# Patient Record
Sex: Female | Born: 2000 | Race: White | Hispanic: No | Marital: Single | Smoking: Never smoker
Health system: Southern US, Community
[De-identification: ages and names within clinical notes are randomized; demographics above are authoritative.]

---
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
## 2021-01-15 ENCOUNTER — Encounter (HOSPITAL_COMMUNITY): Payer: Self-pay

## 2021-01-15 ENCOUNTER — Telehealth (HOSPITAL_COMMUNITY): Payer: Self-pay

## 2021-01-15 ENCOUNTER — Other Ambulatory Visit: Payer: Self-pay

## 2021-01-15 ENCOUNTER — Ambulatory Visit (HOSPITAL_COMMUNITY)
Admission: EM | Admit: 2021-01-15 | Discharge: 2021-01-15 | Disposition: A | Discharge status: Home / Self Care | Payer: BC Managed Care – PPO | Attending: Family Medicine | Admitting: Family Medicine

## 2021-01-15 DIAGNOSIS — T7840XA Allergy, unspecified, initial encounter: Secondary | ICD-10-CM

## 2021-01-15 DIAGNOSIS — L509 Urticaria, unspecified: Secondary | ICD-10-CM

## 2021-01-15 MED ORDER — PREDNISONE 10 MG PO TABS: ORAL_TABLET | ORAL | 0 refills | Status: DC

## 2021-01-15 MED ORDER — CETIRIZINE HCL 10 MG PO TABS: 10.0000 mg | ORAL_TABLET | Freq: Two times a day (BID) | ORAL | 2 refills | Status: DC

## 2021-01-15 MED ORDER — FAMOTIDINE 40 MG PO TABS: 40.0000 mg | ORAL_TABLET | Freq: Two times a day (BID) | ORAL | 0 refills | Status: DC

## 2021-01-15 NOTE — ED Triage Notes (Signed)
Pt presents with an allergic reaction since 6 this morning. Pt states she does not know what she had a reaction to. States she has not had a reaction before to anything and denies allergies to anything.   Pt states she had hives, states she took Benadryl two hours ago and states part of the hives have went away.

## 2021-01-15 NOTE — ED Provider Notes (Signed)
[facility]    CSN: [id number] Arrival date & time: 01/15/21  1705      History   Chief Complaint Chief Complaint  Patient presents with   Allergic Reaction    HPI Jessica Bishop is a 20 y.o. female.   Patient presenting today with 1 day history of diffuse generalized itching and hives.  States she was at [employer] with friends over the weekend, used a new body wash at the place they were staying and a new after sun lotion but otherwise no new exposures that she is aware of.  She denies throat itching or swelling, difficulty breathing, chest pain, nausea, vomiting, diarrhea.  Has not taken anything other than a Benadryl about 2 hours ago which did help significantly.  No past history of allergic reactions.   History reviewed. No pertinent past medical history.  There are no problems to display for this patient.   History reviewed. No pertinent surgical history.  OB History   No obstetric history on file.      Home Medications    Prior to Admission medications   Medication Sig Start Date End Date Taking? Authorizing Provider  cetirizine (ZYRTEC ALLERGY) 10 MG tablet Take 1 tablet (10 mg total) by mouth 2 (two) times daily. 01/15/21  Yes Particia Nearing, PA-C  famotidine (PEPCID) 40 MG tablet Take 1 tablet (40 mg total) by mouth 2 (two) times daily. 01/15/21  Yes Particia Nearing, PA-C  predniSONE (DELTASONE) 10 MG tablet Take 6 tabs day one, 5 tabs day two, 4 tabs day three, etc 01/15/21  Yes Particia Nearing, PA-C    Family History History reviewed. No pertinent family history.  Social History Social History   Tobacco Use   Smoking status: Never   Smokeless tobacco: Never  Vaping Use   Vaping Use: Every day  Substance Use Topics   Alcohol use: Yes   Drug use: Never     Allergies   Patient has no known allergies.   Review of Systems Review of Systems Per HPI  Physical Exam Triage Vital Signs ED Triage Vitals  Enc  Vitals Group     BP 01/15/21 1744 121/82     Pulse Rate 01/15/21 1744 96     Resp 01/15/21 1744 19     Temp 01/15/21 1744 99.1 F (37.3 C)     Temp Source 01/15/21 1744 Oral     SpO2 01/15/21 1744 100 %     Weight --      Height --      Head Circumference --      Peak Flow --      Pain Score 01/15/21 1742 0     Pain Loc --      Pain Edu? --      Excl. in GC? --    No data found.  Updated Vital Signs BP 121/82 (BP Location: Right Arm)   Pulse 96   Temp 99.1 F (37.3 C) (Oral)   Resp 19   LMP 10/18/2020 (Approximate)   SpO2 100%   Visual Acuity Right Eye Distance:   Left Eye Distance:   Bilateral Distance:    Right Eye Near:   Left Eye Near:    Bilateral Near:     Physical Exam Vitals and nursing note reviewed.  Constitutional:      Appearance: Normal appearance. She is not ill-appearing.  HENT:     Head: Atraumatic.     Nose: Nose normal.  Mouth/Throat:     Mouth: Mucous membranes are moist.     Pharynx: Oropharynx is clear. No posterior oropharyngeal erythema.  Eyes:     Extraocular Movements: Extraocular movements intact.     Conjunctiva/sclera: Conjunctivae normal.  Cardiovascular:     Rate and Rhythm: Normal rate and regular rhythm.     Heart sounds: Normal heart sounds.  Pulmonary:     Effort: Pulmonary effort is normal.     Breath sounds: Normal breath sounds. No wheezing or rales.  Musculoskeletal:        General: Normal range of motion.     Cervical back: Normal range of motion and neck supple.  Skin:    General: Skin is warm and dry.     Findings: Rash present.     Comments: Diffuse widespread erythematous urticarial rash all 4 extremities, trunk  Neurological:     Mental Status: She is alert and oriented to person, place, and time.  Psychiatric:        Mood and Affect: Mood normal.        Thought Content: Thought content normal.        Judgment: Judgment normal.     UC Treatments / Results  Labs (all labs ordered are listed, but  only abnormal results are displayed) Labs Reviewed - No data to display  EKG   Radiology No results found.  Procedures Procedures (including critical care time)  Medications Ordered in UC Medications - No data to display  Initial Impression / Assessment and Plan / UC Course  I have reviewed the triage vital signs and the nursing notes.  Pertinent labs & imaging results that were available during my care of the patient were reviewed by me and considered in my medical decision making (see chart for details).     Will treat hives rash/allergic reaction with prednisone taper, Pepcid, Benadryl.  Discussed avoiding any scented or allergenic products.  Return for acutely worsening symptoms.  No evidence of respiratory distress related to allergic reaction.  Final Clinical Impressions(s) / UC Diagnoses   Final diagnoses:  Allergic reaction, initial encounter  Urticaria   Discharge Instructions   None    ED Prescriptions     Medication Sig Dispense Auth. Provider   predniSONE (DELTASONE) 10 MG tablet Take 6 tabs day one, 5 tabs day two, 4 tabs day three, etc 21 tablet Particia Nearing, PA-C   cetirizine (ZYRTEC ALLERGY) 10 MG tablet Take 1 tablet (10 mg total) by mouth 2 (two) times daily. 60 tablet Particia Nearing, [place]   famotidine (PEPCID) 40 MG tablet Take 1 tablet (40 mg total) by mouth 2 (two) times daily. 60 tablet Particia Nearing, [place]      PDMP not reviewed this encounter.   Particia Nearing, [place] 01/15/21 1810

## 2021-02-07 ENCOUNTER — Other Ambulatory Visit: Payer: Self-pay | Admitting: Family Medicine

## 2021-10-23 NOTE — Progress Notes (Unsigned)
 Complete Physical  Assessment and Plan: Health Maintenance- Discussed STD testing, safe sex, alcohol and drug awareness, drinking and driving dangers, wearing a seat belt and general safety measures for young adult. . Discussed med's effects and SE's. Screening labs and tests as requested with regular follow-up as recommended. Over 40 minutes of exam, counseling, chart review and critical decision making was performed  HPI  This very nice 21 y.o.female presents for complete physical.  Patient has no major health issues.  Patient reports no complaints at this time.  She {DOES_DOES WVP:71062} workout.  Finally, patient has history of Vitamin D Deficiency and last vitamin D was No results found for: "VD25OH".  Currently on supplementation  Current Medications:  Current Outpatient Medications on File Prior to Visit  Medication Sig Dispense Refill   cetirizine (ZYRTEC ALLERGY) 10 MG tablet Take 1 tablet (10 mg total) by mouth 2 (two) times daily. 60 tablet 2   famotidine (PEPCID) 40 MG tablet Take 1 tablet (40 mg total) by mouth 2 (two) times daily. 60 tablet 0   predniSONE (DELTASONE) 10 MG tablet Take 6 tabs day one, 5 tabs day two, 4 tabs day three, etc 21 tablet 0   No current facility-administered medications on file prior to visit.   Health Maintenance:    There is no immunization history on file for this patient.  TD/TDAP: Influenza: Pneumovax:  Prevnar 13:  HPV vaccines:   LMP: No LMP recorded. Sexually Active: {YES NO:22349} STD testing offered Pap: MGM: :  Allergies: No Known Allergies Medical History:  does not have a problem list on file. Surgical History:  She  has no past surgical history on file. Family History:  Her family history is not on file. Social History:   reports that she has never smoked. She has never used smokeless tobacco. She reports current alcohol use. She reports that she does not use drugs.  Review of Systems: ROS  Physical Exam: There  is no height or weight on file to calculate BMI. There were no vitals taken for this visit. General Appearance: Well nourished, in no apparent distress.  Eyes: PERRLA, EOMs, conjunctiva no swelling or erythema, normal fundi and vessels.  Sinuses: No Frontal/maxillary tenderness  ENT/Mouth: Ext aud canals clear, normal light reflex with TMs without erythema, bulging. Good dentition. No erythema, swelling, or exudate on post pharynx. Tonsils not swollen or erythematous. Hearing normal.  Neck: Supple, thyroid normal. No bruits  Respiratory: Respiratory effort normal, BS equal bilaterally without rales, rhonchi, wheezing or stridor.  Cardio: RRR without murmurs, rubs or gallops. Brisk peripheral pulses without edema.  Chest: symmetric, with normal excursions and percussion.  Breasts: Symmetric, without lumps, nipple discharge, retractions.  Abdomen: Soft, nontender, no guarding, rebound, hernias, masses, or organomegaly.  Lymphatics: Non tender without lymphadenopathy.  Genitourinary:  Musculoskeletal: Full ROM all peripheral extremities,5/5 strength, and normal gait.  Skin: Warm, dry without rashes, lesions, ecchymosis. Neuro: Cranial nerves intact, reflexes equal bilaterally. Normal muscle tone, no cerebellar symptoms. Sensation intact.  Psych: Awake and oriented X 3, normal affect, Insight and Judgment appropriate.   EKG: defer  Jessica Bishop E  9:43 AM [facility]

## 2021-10-25 ENCOUNTER — Ambulatory Visit (INDEPENDENT_AMBULATORY_CARE_PROVIDER_SITE_OTHER): Payer: BC Managed Care – PPO | Admitting: Nurse Practitioner

## 2021-10-25 ENCOUNTER — Encounter: Payer: Self-pay | Admitting: Nurse Practitioner

## 2021-10-25 VITALS — BP 112/82 | HR 100 | Temp 97.3°F | Ht 66.0 in | Wt 165.8 lb

## 2021-10-25 DIAGNOSIS — F17290 Nicotine dependence, other tobacco product, uncomplicated: Secondary | ICD-10-CM | POA: Diagnosis not present

## 2021-10-25 DIAGNOSIS — K219 Gastro-esophageal reflux disease without esophagitis: Secondary | ICD-10-CM

## 2021-10-25 DIAGNOSIS — Z3041 Encounter for surveillance of contraceptive pills: Secondary | ICD-10-CM

## 2021-10-25 DIAGNOSIS — E663 Overweight: Secondary | ICD-10-CM | POA: Diagnosis not present

## 2021-10-25 MED ORDER — LEVONORGEST-ETH ESTRAD 91-DAY 0.15-0.03 &0.01 MG PO TABS: 1.0000 | ORAL_TABLET | Freq: Every day | ORAL | 3 refills | Status: DC

## 2021-10-25 NOTE — Patient Instructions (Signed)
Take Famotidine daily for 1 month, if symptoms do not improve notify the office Famotidine Tablets What is this medication? FAMOTIDINE (fa MOE ti deen) treats heartburn, stomach ulcers, reflux disease, or other conditions that cause too much stomach acid. It works by reducing the amount of acid in the stomach. This medicine may be used for other purposes; ask your health care provider or pharmacist if you have questions. COMMON BRAND NAME(S): Heartburn Relief, Pepcid, Pepcid AC, Pepcid AC Maximum Strength, Zantac, Zantac 360 What should I tell my care team before I take this medication? They need to know if you have any of these conditions: Kidney disease Liver disease Trouble swallowing An unusual or allergic reaction to famotidine, other medications, foods, dyes, or preservatives Pregnant or trying to get pregnant Breast-feeding How should I use this medication? Take this medication by mouth with a glass of water. Follow the directions on the prescription label. If you only take this medication once a day, take it at bedtime. Take your doses at regular intervals. Do not take your medication more often than directed. Talk to your care team about the use of this medication in children. Special care may be needed. Overdosage: If you think you have taken too much of this medicine contact a poison control center or emergency room at once. NOTE: This medicine is only for you. Do not share this medicine with others. What if I miss a dose? If you miss a dose, take it as soon as you can. If it is almost time for your next dose, take only that dose. Do not take double or extra doses. What may interact with this medication? Delavirdine Itraconazole Ketoconazole This list may not describe all possible interactions. Give your health care provider a list of all the medicines, herbs, non-prescription drugs, or dietary supplements you use. Also tell them if you smoke, drink alcohol, or use illegal drugs.  Some items may interact with your medicine. What should I watch for while using this medication? Tell your care team if your condition does not start to get better or if it gets worse. Do not take with aspirin, ibuprofen or other antiinflammatory medications. These can make your condition worse. Do not smoke cigarettes or drink alcohol. These cause irritation in your stomach and can increase the time it will take for ulcers to heal. If you get black, tarry stools or vomit up what looks like coffee grounds, call your care team at once. You may have a bleeding ulcer. This medication may cause a decrease in vitamin B12. You should make sure that you get enough vitamin B12 while you are taking this medication. Discuss the foods you eat and the vitamins you take with your care team. What side effects may I notice from receiving this medication? Side effects that you should report to your care team as soon as possible: Allergic reactions--skin rash, itching, hives, swelling of the face, lips, tongue, or throat Confusion Hallucinations Side effects that usually do not require medical attention (report to your care team if they continue or are bothersome): Constipation Diarrhea Dizziness Headache This list may not describe all possible side effects. Call your doctor for medical advice about side effects. You may report side effects to FDA at 1-800-FDA-1088. Where should I keep my medication? Keep out of the reach of children and pets. Store at room temperature between 15 and 30 degrees C (59 and 86 degrees F). Do not freeze. Throw away any unused medication after the expiration date. NOTE: This  sheet is a summary. It may not cover all possible information. If you have questions about this medicine, talk to your doctor, pharmacist, or health care provider.  2023 Elsevier/Gold Standard (2020-04-12 00:00:00)

## 2022-01-22 ENCOUNTER — Encounter (HOSPITAL_COMMUNITY): Payer: Self-pay | Admitting: *Deleted

## 2022-01-22 ENCOUNTER — Other Ambulatory Visit: Payer: Self-pay

## 2022-01-22 ENCOUNTER — Emergency Department (HOSPITAL_COMMUNITY)
Admission: EM | Admit: 2022-01-22 | Discharge: 2022-01-22 | Disposition: A | Discharge status: Home / Self Care | Payer: BC Managed Care – PPO | Attending: Emergency Medicine | Admitting: Emergency Medicine

## 2022-01-22 DIAGNOSIS — R55 Syncope and collapse: Secondary | ICD-10-CM

## 2022-01-22 LAB — COMPREHENSIVE METABOLIC PANEL WITH GFR
ALT: 21 U/L (ref 0–44)
AST: 16 U/L (ref 15–41)
Albumin: 4 g/dL (ref 3.5–5.0)
Alkaline Phosphatase: 59 U/L (ref 38–126)
Anion gap: 7 (ref 5–15)
BUN: 10 mg/dL (ref 6–20)
CO2: 23 mmol/L (ref 22–32)
Calcium: 9.3 mg/dL (ref 8.9–10.3)
Chloride: 105 mmol/L (ref 98–111)
Creatinine, Ser: 0.76 mg/dL (ref 0.44–1.00)
GFR, Estimated: >60 mL/min
Glucose, Bld: 96 mg/dL (ref 70–99)
Potassium: 4.2 mmol/L (ref 3.5–5.1)
Sodium: 135 mmol/L (ref 135–145)
Total Bilirubin: 0.7 mg/dL (ref 0.3–1.2)
Total Protein: 7.9 g/dL (ref 6.5–8.1)

## 2022-01-22 LAB — CBC WITH DIFFERENTIAL/PLATELET
Abs Immature Granulocytes: 0.02 K/uL (ref 0.00–0.07)
Basophils Absolute: 0 K/uL (ref 0.0–0.1)
Basophils Relative: 1 %
Eosinophils Absolute: 0.1 K/uL (ref 0.0–0.5)
Eosinophils Relative: 1 %
HCT: 49.2 % — ABNORMAL HIGH (ref 36.0–46.0)
Hemoglobin: 16.3 g/dL — ABNORMAL HIGH (ref 12.0–15.0)
Immature Granulocytes: 0 %
Lymphocytes Relative: 25 %
Lymphs Abs: 1.8 K/uL (ref 0.7–4.0)
MCH: 29.8 pg (ref 26.0–34.0)
MCHC: 33.1 g/dL (ref 30.0–36.0)
MCV: 89.9 fL (ref 80.0–100.0)
Monocytes Absolute: 0.4 K/uL (ref 0.1–1.0)
Monocytes Relative: 6 %
Neutro Abs: 5 K/uL (ref 1.7–7.7)
Neutrophils Relative %: 67 %
Platelets: 373 K/uL (ref 150–400)
RBC: 5.47 MIL/uL — ABNORMAL HIGH (ref 3.87–5.11)
RDW: 11.9 % (ref 11.5–15.5)
WBC: 7.4 K/uL (ref 4.0–10.5)
nRBC: 0 % (ref 0.0–0.2)

## 2022-01-22 LAB — D-DIMER, QUANTITATIVE: D-Dimer, Quant: 0.35 ug{FEU}/mL (ref 0.00–0.50)

## 2022-01-22 LAB — HCG, QUANTITATIVE, PREGNANCY: hCG, Beta Chain, Quant, S: <1 m[IU]/mL (ref <5)

## 2022-01-22 NOTE — ED Provider Triage Note (Signed)
Emergency Medicine Provider Triage Evaluation Note  Jessica Bishop , a 21 y.o. female  was evaluated in triage.  Pt complains of loss of consciousness.  Patient states she was brushing her teeth this morning and suddenly had ringing in the ears, seeing bright lights then she sat down.  Patient states losing consciousness for a few seconds and then her boyfriend came and helped her get up to go back to the bed.  Patient reports 2 similar episodes in the past in 2021 and 2022.  Endorses headache and dizziness before the episode.  Denies chest pain, shortness of breath, nausea, vomiting.  Patient is currently taking contraception pills.  Denies any head injury. Review of Systems  Positive: Headache Negative: Fever  Physical Exam  BP (!) 136/94   Pulse 97   Temp 98.8 F (37.1 C) (Oral)   Resp 18   Ht 5\' 6"  (1.676 m)   Wt 74.8 kg   SpO2 94%   BMI 26.63 kg/m  Gen:   Awake, no distress   Resp:  Normal effort  MSK:   Moves extremities without difficulty  Other:    Medical Decision Making  Medically screening exam initiated at 1:07 PM.  Appropriate orders placed.  BIRDELL FRASIER was informed that the remainder of the evaluation will be completed by another provider, this initial triage assessment does not replace that evaluation, and the importance of remaining in the ED until their evaluation is complete.  Work-up initiated   Rex Kras, [place] 01/22/22 1313

## 2022-01-22 NOTE — ED Triage Notes (Signed)
Pt states she was brushing her teeth, felt hot, dat down followed by syncopal episode, felt "fuzzy" in head after event, Nausea with episode. Still has a slight headache in triage

## 2022-01-22 NOTE — Discharge Instructions (Addendum)
You are seen today in the emergency department for loss of consciousness.  Your blood work is very reassuring, no signs of anemia, electrolyte abnormality in the blood clot enzyme is negative so I do not think you have a clot.  Does not appear consistent with a seizure although I do think you need to follow-up with neurology, information above.  Please call tomorrow and schedule an appointment to see if they are able to see you.

## 2022-01-22 NOTE — ED Provider Notes (Signed)
[facility] DEPT Provider Note   CSN: [id number] Arrival date & time: 01/22/22  1230     History  Chief Complaint  Patient presents with   Loss of Consciousness    Jessica Bishop is a 21 y.o. female.   Loss of Consciousness    Patient on oral contraceptive medicine presents today due to syncopal episode.  Patient states she has had 3 syncopal episodes in total, usually has 1 every year starting 3 years ago.  It happened while she was brushing her teeth, she felt lightheaded, felt her ears were ringing and that she was going to pass out.  She laid down on the floor and then had a syncopal episode, unsure how long it lasted for.  Denies any prodromal chest pain or shortness of breath, she did not have any head trauma she was already lying on the floor.  No history of seizures per the patient that her brother has epilepsy.  She is not on any medicine other than oral birth control, has never seen a neurologist or cardiologist.  Home Medications Prior to Admission medications   Medication Sig Start Date End Date Taking? Authorizing Provider  Levonorgestrel-Ethinyl Estradiol (AMETHIA) 0.15-0.03 &0.01 MG tablet Take 1 tablet by mouth daily. 10/25/21   Alycia Rossetti, NP      Allergies    Patient has no known allergies.    Review of Systems   Review of Systems  Cardiovascular:  Positive for syncope.    Physical Exam Updated Vital Signs BP 106/86 (BP Location: Right Arm)   Pulse 74   Temp 98.8 F (37.1 C) (Oral)   Resp 16   Ht 5\' 6"  (1.676 m)   Wt 74.8 kg   SpO2 100%   BMI 26.63 kg/m  Physical Exam Vitals and nursing note reviewed. Exam conducted with a chaperone present.  Constitutional:      Appearance: Normal appearance.  HENT:     Head: Normocephalic and atraumatic.  Eyes:     General: No scleral icterus.       Right eye: No discharge.        Left eye: No discharge.     Extraocular Movements: Extraocular movements intact.      Pupils: Pupils are equal, round, and reactive to light.  Cardiovascular:     Rate and Rhythm: Normal rate and regular rhythm.     Pulses: Normal pulses.     Heart sounds: Normal heart sounds. No murmur heard.    No friction rub. No gallop.  Pulmonary:     Effort: Pulmonary effort is normal. No respiratory distress.     Breath sounds: Normal breath sounds.  Abdominal:     General: Abdomen is flat. Bowel sounds are normal. There is no distension.     Palpations: Abdomen is soft.     Tenderness: There is no abdominal tenderness.  Skin:    General: Skin is warm and dry.     Coloration: Skin is not jaundiced.  Neurological:     Mental Status: She is alert. Mental status is at baseline.     Coordination: Coordination normal.     Comments: Cranial nerves II through XII grossly intact, upper and lower extremity strength symmetric bilaterally.  Normal finger-nose, no pronator drift     ED Results / Procedures / Treatments   Labs (all labs ordered are listed, but only abnormal results are displayed) Labs Reviewed  CBC WITH DIFFERENTIAL/PLATELET - Abnormal; Notable for the following components:  Result Value   RBC 5.47 (*)    Hemoglobin 16.3 (*)    HCT 49.2 (*)    All other components within normal limits  COMPREHENSIVE METABOLIC PANEL  HCG, QUANTITATIVE, PREGNANCY  D-DIMER, QUANTITATIVE    EKG None  Radiology No results found.  Procedures Procedures    Medications Ordered in ED Medications - No data to display  ED Course/ Medical Decision Making/ A&P                           Medical Decision Making Amount and/or Complexity of Data Reviewed Labs: ordered.   Patient presents due to syncopal event.  Differential includes but not limited to seizure, arrhythmia, metabolic abnormality, PE, medication side effect, vasovagal.  On exam patient is oriented x3.  No focal deficits, upper and lower extremity pulses symmetric bilaterally with regular rhythm.  Regular rhythm,  mildly tachycardic. -BP (!) 136/94   Pulse 97   Temp 98.8 F (37.1 C) (Oral)   Resp 18   Ht 5\' 6"  (1.676 m)   Wt 74.8 kg   SpO2 94%   BMI 26.63 kg/m   I ordered, viewed and interpreted laboratory work-up. -CBC without leukocytosis or anemia. -CMP without gross electrolyte derangement, AKI or transaminitis. -Dimer is negative, not consistent with a PE.  I ordered EKG which showed sinus tachycardia with nonspecific T wave changes in pulmonary pattern.  Grade radiologist dictation for the patient was on cardiac monitoring with a rate in the 90s per my interpretation.  No signs of underlying arrhythmia, no PE, no signs of AKI, metabolic abnormality, underlying infection or anemia.  Normal neuro exam, physical exam is not regularly feeling and this seems to be initiated by going on for multiple years and has not really been thoroughly worked up.  Does not appear consistent with a seizure.  I considered CT of the head but there is no lateralized weakness, no specific head trauma and I do not think indicated specially given she did not hit her head.  Considered admission but given this is been more of a chronic issue I do think she is appropriate for close outpatient follow-up with neurology.  Return precautions were discussed with the patient, she will follow-up with neurology and referral was placed.          Final Clinical Impression(s) / ED Diagnoses Final diagnoses:  Syncope and collapse    Rx / DC Orders ED Discharge Orders     None         Sherrill Raring, Hershal Coria 01/22/22 2148    Carmin Muskrat, MD 01/23/22 2023

## 2022-04-24 ENCOUNTER — Ambulatory Visit: Payer: BC Managed Care – PPO | Admitting: Nurse Practitioner

## 2022-05-18 ENCOUNTER — Ambulatory Visit: Payer: BC Managed Care – PPO | Admitting: Nurse Practitioner

## 2022-10-12 ENCOUNTER — Other Ambulatory Visit: Payer: Self-pay | Admitting: Nurse Practitioner

## 2023-01-05 ENCOUNTER — Other Ambulatory Visit: Payer: Self-pay | Admitting: Nurse Practitioner

## 2023-03-14 ENCOUNTER — Encounter: Payer: Self-pay | Admitting: Nurse Practitioner

## 2023-03-14 ENCOUNTER — Ambulatory Visit (INDEPENDENT_AMBULATORY_CARE_PROVIDER_SITE_OTHER): Payer: BC Managed Care – PPO | Admitting: Nurse Practitioner

## 2023-03-14 VITALS — BP 102/80 | HR 103 | Temp 98.1°F | Ht 66.0 in | Wt 188.6 lb

## 2023-03-14 DIAGNOSIS — Z0001 Encounter for general adult medical examination with abnormal findings: Secondary | ICD-10-CM

## 2023-03-14 DIAGNOSIS — Z130 Encounter for screening for diseases of the blood and blood-forming organs and certain disorders involving the immune mechanism: Secondary | ICD-10-CM

## 2023-03-14 DIAGNOSIS — Z79899 Other long term (current) drug therapy: Secondary | ICD-10-CM | POA: Diagnosis not present

## 2023-03-14 DIAGNOSIS — Z1389 Encounter for screening for other disorder: Secondary | ICD-10-CM

## 2023-03-14 DIAGNOSIS — Z13220 Encounter for screening for lipoid disorders: Secondary | ICD-10-CM

## 2023-03-14 DIAGNOSIS — Z0000 Encounter for general adult medical examination without abnormal findings: Secondary | ICD-10-CM | POA: Diagnosis not present

## 2023-03-14 DIAGNOSIS — Z131 Encounter for screening for diabetes mellitus: Secondary | ICD-10-CM

## 2023-03-14 DIAGNOSIS — E559 Vitamin D deficiency, unspecified: Secondary | ICD-10-CM

## 2023-03-14 DIAGNOSIS — K219 Gastro-esophageal reflux disease without esophagitis: Secondary | ICD-10-CM

## 2023-03-14 DIAGNOSIS — E66811 Obesity, class 1: Secondary | ICD-10-CM

## 2023-03-14 DIAGNOSIS — F17290 Nicotine dependence, other tobacco product, uncomplicated: Secondary | ICD-10-CM

## 2023-03-14 DIAGNOSIS — Z1329 Encounter for screening for other suspected endocrine disorder: Secondary | ICD-10-CM

## 2023-03-14 DIAGNOSIS — Z3041 Encounter for surveillance of contraceptive pills: Secondary | ICD-10-CM

## 2023-03-14 MED ORDER — LEVONORGEST-ETH ESTRAD 91-DAY 0.15-0.03 &0.01 MG PO TABS: 1.0000 | ORAL_TABLET | Freq: Every day | ORAL | 0 refills | Status: DC

## 2023-03-14 NOTE — Progress Notes (Signed)
COMPLETE PHYSICAL   Assessment and Plan:  Jessica Bishop was seen today for follow-up.  Diagnoses and all orders for this visit:  Encounter for general adult medical examination with abnormal findings Due Yearly  Medication management -     CBC with Differential/Platelet -     COMPLETE METABOLIC PANEL WITH GFR -     Magnesium -     Lipid panel -     TSH -     Hemoglobin A1C w/out eAG -     VITAMIN D 25 Hydroxy (Vit-D Deficiency, Fractures) -     Urinalysis, Routine w reflex microscopic -     Levonorgestrel-Ethinyl Estradiol (SIMPESSE) 0.15-0.03 &0.01 MG tablet; Take 1 tablet by mouth daily.  Screening for hematuria or proteinuria -     Lipid panel -     Urinalysis, Routine w reflex microscopic  Screening for thyroid disorder -     TSH  Gastroesophageal reflux disease, unspecified whether esophagitis present Occasional use of Pepcid Monitor symptoms and continue dietary modifications -     COMPLETE METABOLIC PANEL WITH GFR -     Magnesium  Oral contraceptive use Continue use and monitor periods -     Levonorgestrel-Ethinyl Estradiol (SIMPESSE) 0.15-0.03 &0.01 MG tablet; Take 1 tablet by mouth daily.  Obesity (BMI 30.0-34.9) Long discussion about weight loss, diet, and exercise Recommended diet heavy in fruits and veggies and low in animal meats, cheeses, and dairy products, appropriate calorie intake Patient will work on decreasing saturated fat and simple carbs.  Increase exercise Follow up at next visit  Vaping nicotine dependence, tobacco product Strongly encouraged to cut back and quit  Screening for deficiency anemia -     CBC with Differential/Platelet  Screening for diabetes mellitus -     Hemoglobin A1C w/out eAG  Vitamin D deficiency -     VITAMIN D 25 Hydroxy (Vit-D Deficiency, Fractures)       Further disposition pending results of labs. Discussed med's effects and SE's.   Over 30 minutes of exam, counseling, chart review, and critical decision making  was performed.   Future Appointments  Date Time Provider Department Center  03/14/2023 10:00 AM Raynelle Dick, NP [facility] None    ------------------------------------------------------------------------------------------------------------------   HPI Pulse (!) 103   Temp 98.1 F (36.7 C)   Ht 5\' 6"  (1.676 m)   Wt 188 lb 9.6 oz (85.5 kg)   SpO2 97%   BMI 30.44 kg/m  22 y.o.female presents for refill of medications   BP well controlled without medication BP Readings from Last 3 Encounters:  01/22/22 106/86  10/25/21 112/82  01/15/21 121/82  Denies headaches, chest pain, shortness of breath and dizziness   BMI is Body mass index is 30.44 kg/m., she has not been working on diet and exercise. Wt Readings from Last 3 Encounters:  03/14/23 188 lb 9.6 oz (85.5 kg)  01/22/22 165 lb (74.8 kg)  10/25/21 165 lb 12.8 oz (75.2 kg)   Pulse Readings from Last 3 Encounters:  03/14/23 (!) 103  01/22/22 74  10/25/21 100      No past medical history on file.   No Known Allergies  Current Outpatient Medications on File Prior to Visit  Medication Sig   Levonorgestrel-Ethinyl Estradiol (SIMPESSE) 0.15-0.03 &0.01 MG tablet TAKE 1 TABLET BY MOUTH EVERY DAY   No current facility-administered medications on file prior to visit.    Review of Systems  Constitutional:  Negative for chills and fever.  HENT:  Negative for congestion, hearing  loss, sinus pain, sore throat and tinnitus.   Eyes:  Negative for blurred vision and double vision.  Respiratory:  Negative for cough, hemoptysis, sputum production, shortness of breath and wheezing.   Cardiovascular:  Negative for chest pain, palpitations and leg swelling.  Gastrointestinal:  Negative for abdominal pain, constipation, diarrhea, heartburn, nausea and vomiting.  Genitourinary:  Negative for dysuria and urgency.  Musculoskeletal:  Negative for back pain, falls, joint pain, myalgias and neck pain.  Skin:  Negative for rash.   Neurological:  Negative for dizziness, tingling, tremors, weakness and headaches.  Endo/Heme/Allergies:  Does not bruise/bleed easily.  Psychiatric/Behavioral:  Negative for depression and suicidal ideas. The patient is not nervous/anxious and does not have insomnia.      Physical Exam:  Pulse (!) 103   Temp 98.1 F (36.7 C)   Ht 5\' 6"  (1.676 m)   Wt 188 lb 9.6 oz (85.5 kg)   SpO2 97%   BMI 30.44 kg/m   General Appearance: Well nourished, in no apparent distress. Eyes: PERRLA, EOMs, conjunctiva no swelling or erythema Sinuses: No Frontal/maxillary tenderness ENT/Mouth: Ext aud canals clear, TMs without erythema, bulging. No erythema, swelling, or exudate on post pharynx.   Hearing normal.  Neck: Supple, thyroid normal.  Respiratory: Respiratory effort normal, BS equal bilaterally without rales, rhonchi, wheezing or stridor.  Cardio: RRR with no MRGs. Brisk peripheral pulses without edema.  Abdomen: Soft, + BS.  Non tender, no guarding, rebound, hernias, masses. Lymphatics: Non tender without lymphadenopathy.  Musculoskeletal: Full ROM, 5/5 strength, normal gait.  Skin: Warm, dry without rashes, lesions, ecchymosis.  Neuro: Cranial nerves intact. Normal muscle tone, no cerebellar symptoms. Sensation intact.  Psych: Awake and oriented X 3, normal affect, Insight and Judgment appropriate.     Raynelle Dick, NP 9:53 AM [facility]

## 2023-03-14 NOTE — Patient Instructions (Signed)

## 2023-03-15 LAB — URINALYSIS, ROUTINE W REFLEX MICROSCOPIC
Bilirubin Urine: NEGATIVE
Glucose, UA: NEGATIVE
Hgb urine dipstick: NEGATIVE
Ketones, ur: NEGATIVE
Leukocytes,Ua: NEGATIVE
Nitrite: NEGATIVE
Protein, ur: NEGATIVE
Specific Gravity, Urine: 1.022 (ref 1.001–1.035)
pH: 6 (ref 5.0–8.0)

## 2023-03-15 LAB — CBC WITH DIFFERENTIAL/PLATELET
Absolute Lymphocytes: 1732 {cells}/uL (ref 850–3900)
Absolute Monocytes: 548 {cells}/uL (ref 200–950)
Basophils Absolute: 52 {cells}/uL (ref 0–200)
Basophils Relative: 0.7 %
Eosinophils Absolute: 89 {cells}/uL (ref 15–500)
Eosinophils Relative: 1.2 %
HCT: 46.9 % — ABNORMAL HIGH (ref 35.0–45.0)
Hemoglobin: 15.3 g/dL (ref 11.7–15.5)
MCH: 29.3 pg (ref 27.0–33.0)
MCHC: 32.6 g/dL (ref 32.0–36.0)
MCV: 89.8 fL (ref 80.0–100.0)
MPV: 9.8 fL (ref 7.5–12.5)
Monocytes Relative: 7.4 %
Neutro Abs: 4980 {cells}/uL (ref 1500–7800)
Neutrophils Relative %: 67.3 %
Platelets: 397 10*3/uL (ref 140–400)
RBC: 5.22 10*6/uL — ABNORMAL HIGH (ref 3.80–5.10)
RDW: 12.1 % (ref 11.0–15.0)
Total Lymphocyte: 23.4 %
WBC: 7.4 10*3/uL (ref 3.8–10.8)

## 2023-03-15 LAB — VITAMIN D 25 HYDROXY (VIT D DEFICIENCY, FRACTURES): Vit D, 25-Hydroxy: 29 ng/mL — ABNORMAL LOW (ref 30–100)

## 2023-03-15 LAB — COMPLETE METABOLIC PANEL WITH GFR
AG Ratio: 1.4 (calc) (ref 1.0–2.5)
ALT: 25 U/L (ref 6–29)
AST: 17 U/L (ref 10–30)
Albumin: 4.1 g/dL (ref 3.6–5.1)
Alkaline phosphatase (APISO): 73 U/L (ref 31–125)
BUN: 12 mg/dL (ref 7–25)
CO2: 25 mmol/L (ref 20–32)
Calcium: 9.3 mg/dL (ref 8.6–10.2)
Chloride: 106 mmol/L (ref 98–110)
Creat: 0.92 mg/dL (ref 0.50–0.96)
Globulin: 2.9 g/dL (ref 1.9–3.7)
Glucose, Bld: 91 mg/dL (ref 65–99)
Potassium: 4.8 mmol/L (ref 3.5–5.3)
Sodium: 139 mmol/L (ref 135–146)
Total Bilirubin: 0.4 mg/dL (ref 0.2–1.2)
Total Protein: 7 g/dL (ref 6.1–8.1)
eGFR: 91 mL/min/{1.73_m2} (ref >=60)

## 2023-03-15 LAB — TSH: TSH: 2.28 m[IU]/L

## 2023-03-15 LAB — LIPID PANEL
Cholesterol: 187 mg/dL (ref <200)
HDL: 59 mg/dL (ref >=50)
LDL Cholesterol (Calc): 111 mg/dL — ABNORMAL HIGH
Non-HDL Cholesterol (Calc): 128 mg/dL (ref <130)
Total CHOL/HDL Ratio: 3.2 (calc) (ref <5.0)
Triglycerides: 84 mg/dL (ref <150)

## 2023-03-15 LAB — MAGNESIUM: Magnesium: 2.1 mg/dL (ref 1.5–2.5)

## 2023-03-15 LAB — HEMOGLOBIN A1C W/OUT EAG: Hgb A1c MFr Bld: 5.1 %{Hb} (ref <5.7)

## 2023-03-30 DIAGNOSIS — J0100 Acute maxillary sinusitis, unspecified: Secondary | ICD-10-CM | POA: Diagnosis not present

## 2023-06-04 ENCOUNTER — Encounter: Payer: Self-pay | Admitting: *Deleted

## 2023-06-18 ENCOUNTER — Other Ambulatory Visit: Payer: Self-pay

## 2023-06-18 DIAGNOSIS — Z3041 Encounter for surveillance of contraceptive pills: Secondary | ICD-10-CM

## 2023-06-18 DIAGNOSIS — Z79899 Other long term (current) drug therapy: Secondary | ICD-10-CM

## 2023-06-18 MED ORDER — LEVONORGEST-ETH ESTRAD 91-DAY 0.15-0.03 &0.01 MG PO TABS: 1.0000 | ORAL_TABLET | Freq: Every day | ORAL | 0 refills | Status: AC

## 2023-06-27 ENCOUNTER — Encounter: Payer: Self-pay | Admitting: *Deleted

## 2023-09-16 ENCOUNTER — Other Ambulatory Visit: Payer: Self-pay | Admitting: Family

## 2023-09-16 DIAGNOSIS — Z79899 Other long term (current) drug therapy: Secondary | ICD-10-CM

## 2023-09-16 DIAGNOSIS — Z3041 Encounter for surveillance of contraceptive pills: Secondary | ICD-10-CM

## 2024-03-16 ENCOUNTER — Encounter: Payer: BC Managed Care – PPO | Admitting: Nurse Practitioner
